# Patient Record
Sex: Male | Born: 1990 | Race: Black or African American | Hispanic: No | Marital: Married
Health system: Southern US, Community
[De-identification: ages and names within clinical notes are randomized; demographics above are authoritative.]

---

## 2021-02-21 ENCOUNTER — Other Ambulatory Visit: Payer: Self-pay

## 2021-02-21 ENCOUNTER — Emergency Department
Admission: EM | Admit: 2021-02-21 | Discharge: 2021-02-21 | Disposition: A | Discharge status: Home / Self Care | Attending: Emergency Medicine | Admitting: Emergency Medicine

## 2021-02-21 ENCOUNTER — Emergency Department

## 2021-02-21 DIAGNOSIS — M779 Enthesopathy, unspecified: Secondary | ICD-10-CM | POA: Insufficient documentation

## 2021-02-21 DIAGNOSIS — M25572 Pain in left ankle and joints of left foot: Secondary | ICD-10-CM | POA: Diagnosis present

## 2021-02-21 MED ORDER — IBUPROFEN 400 MG PO TABS
400.0000 mg | ORAL_TABLET | Freq: Once | ORAL | Status: AC
  Administered 2021-02-21: 400 mg via ORAL
  Filled 2021-02-21: qty 1

## 2021-02-21 MED ORDER — ACETAMINOPHEN 500 MG PO TABS
1000.0000 mg | ORAL_TABLET | Freq: Once | ORAL | Status: AC
  Administered 2021-02-21: 1000 mg via ORAL
  Filled 2021-02-21: qty 2

## 2021-02-21 NOTE — ED Provider Notes (Signed)
[facility] Emergency Department Provider Note  ____________________________________________   Event Date/Time   First MD Initiated Contact with Patient 02/21/21 1301     (approximate)  I have reviewed the triage vital signs and the nursing notes.   HISTORY  Chief Complaint Ankle Pain   HPI Manuel Wright is a 30 y.o. male with a past medical history of injury in the left foot requiring surgery in 2019 without any subsequent issues who presents for assessment approximately 3 days of some pain and a little bit of swelling extending from the left medial midfoot along the posterior aspect of the inside of the ankle.  No pain over the outside aspect of the ankle or any other swelling redness, or other overlying skin changes.  Patient does not recall any recent injuries or falls.  He denies any other sick symptoms including fevers, chills, cough, nausea, vomiting, diarrhea, dysuria, rash or pain in any other joint.  He has no other acute concerns at this time.  He has not taken any analgesia today.         History reviewed. No pertinent past medical history.  There are no problems to display for this patient.   History reviewed. No pertinent surgical history.  Prior to Admission medications   Not on File    Allergies Patient has no allergy information on record.  No family history on file.  Social History    Review of Systems  Review of Systems  Constitutional:  Negative for chills and fever.  HENT:  Negative for sore throat.   Eyes:  Negative for pain.  Respiratory:  Negative for cough and stridor.   Cardiovascular:  Negative for chest pain.  Gastrointestinal:  Negative for vomiting.  Genitourinary:  Negative for dysuria.  Musculoskeletal:  Positive for joint pain (L ankle).  Skin:  Negative for rash.  Neurological:  Negative for seizures, loss of consciousness and headaches.  Psychiatric/Behavioral:  Negative for suicidal ideas.   All other  systems reviewed and are negative.    ____________________________________________   PHYSICAL EXAM:  VITAL SIGNS: ED Triage Vitals  Enc Vitals Group     BP 02/21/21 1208 114/77     Pulse Rate 02/21/21 1208 73     Resp 02/21/21 1208 16     Temp 02/21/21 1208 98.7 F (37.1 C)     Temp src --      SpO2 02/21/21 1208 100 %     Weight 02/21/21 1210 170 lb (77.1 kg)     Height 02/21/21 1210 5\' 8"  (1.727 m)     Head Circumference --      Peak Flow --      Pain Score 02/21/21 1214 10     Pain Loc --      Pain Edu? --      Excl. in GC? --    Vitals:   02/21/21 1208  BP: 114/77  Pulse: 73  Resp: 16  Temp: 98.7 F (37.1 C)  SpO2: 100%   Physical Exam Vitals and nursing note reviewed.  Constitutional:      Appearance: He is well-developed.  HENT:     Head: Normocephalic and atraumatic.     Right Ear: External ear normal.     Left Ear: External ear normal.     Nose: Nose normal.  Eyes:     Conjunctiva/sclera: Conjunctivae normal.  Cardiovascular:     Rate and Rhythm: Normal rate and regular rhythm.     Heart sounds: No  murmur heard. Pulmonary:     Effort: Pulmonary effort is normal. No respiratory distress.     Breath sounds: Normal breath sounds.  Abdominal:     Palpations: Abdomen is soft.     Tenderness: There is no abdominal tenderness.  Musculoskeletal:     Cervical back: Neck supple.  Skin:    General: Skin is warm and dry.     Capillary Refill: Capillary refill takes less than 2 seconds.  Neurological:     Mental Status: He is alert and oriented to person, place, and time.  Psychiatric:        Mood and Affect: Mood normal.    Patient mild tenderness along the distribution of the left posterior tibialis tendon extending from the medial left midfoot along the posterior aspect of the medial malleolus to the left tibial insertion.  There is no large effusion deformity around the ankle itself and patient is able to plantar and dorsiflex.  2+ DP pulse.  Sensation  intact light touch about the foot. ____________________________________________   LABS (all labs ordered are listed, but only abnormal results are displayed)  Labs Reviewed - No data to display ____________________________________________  EKG  ____________________________________________  RADIOLOGY  ED MD interpretation: Plain film left ankle shows sequelae of remote injuries and small auscultations the medial talus.  Less likely an avulsion fracture.  No other clear acute fracture or dislocation.   Official radiology report(s): DG Ankle Complete Left  Result Date: 02/21/2021 CLINICAL DATA:  Left ankle pain EXAM: LEFT ANKLE COMPLETE - 3+ VIEW COMPARISON:  None. FINDINGS: Small ossicle adjacent to the medial talus. Ankle mortise is well maintained on nonweightbearing views. Minimal degenerative changes of the midfoot. Soft tissues are unremarkable. IMPRESSION: Small ossicle seen adjacent to the medial talus, likely sequela of remote prior trauma, although avulsion fracture could have a similar appearance. Correlate for point tenderness. Electronically Signed   By: Allegra Lai MD   On: 02/21/2021 13:36    ____________________________________________   PROCEDURES  Procedure(s) performed (including Critical Care):  Procedures   ____________________________________________   INITIAL IMPRESSION / ASSESSMENT AND PLAN / ED COURSE      Patient presents with above-stated history exam for assessment approximately 3 days of nontraumatic pain in the left midfoot extending along the inside posterior to the medial malleolus to the left lower leg to the mid posterior tibia.  Is some mild tenderness along this aspect and very minimal swelling on the midfoot.  He is afebrile and hemodynamically stable.  He is otherwise neurovascular intact.  Impression is likely tendinitis.  No historical or exam features to suggest a septic joint, cellulitis, DVT or other acute infectious process.  No  historical or exam features to suggest acute injury and x-ray shows no clear acute fracture or dislocation.  Will place patient in immobilizer boot and instructed to take ibuprofen and Tylenol.  Instructed to follow-up with PCP.  Discharged stable condition.  Strict return precautions advised and discussed       ____________________________________________   FINAL CLINICAL IMPRESSION(S) / ED DIAGNOSES  Final diagnoses:  Tendonitis    Medications  acetaminophen (TYLENOL) tablet 1,000 mg (has no administration in time range)  ibuprofen (ADVIL) tablet 400 mg (has no administration in time range)     ED Discharge Orders     None        Note:  This document was prepared using Dragon voice recognition software and may include unintentional dictation errors.    Gilles Chiquito, MD 02/21/21  1415  

## 2021-02-21 NOTE — ED Triage Notes (Signed)
Pt to ER with complaints of left sided ankle pain since Monday, reports possibly walking wrong on it but denies strenuous activity or falling. Reports driving from [place]. Reports hx of surgery on left ankle in May 2019.

## 2021-02-21 NOTE — ED Notes (Signed)
Pt in XRAY 

## 2022-10-29 IMAGING — CR DG ANKLE COMPLETE 3+V*L*
1 series · 3 of 3 positions shown · non-contrast
Comparison: None.

CLINICAL DATA: Left ankle pain

EXAM:
LEFT ANKLE COMPLETE - 3+ VIEW

[Series 1: x ankle ap left · 0.14mm/px · 3 of 3 slices shown]
[im 1/3]
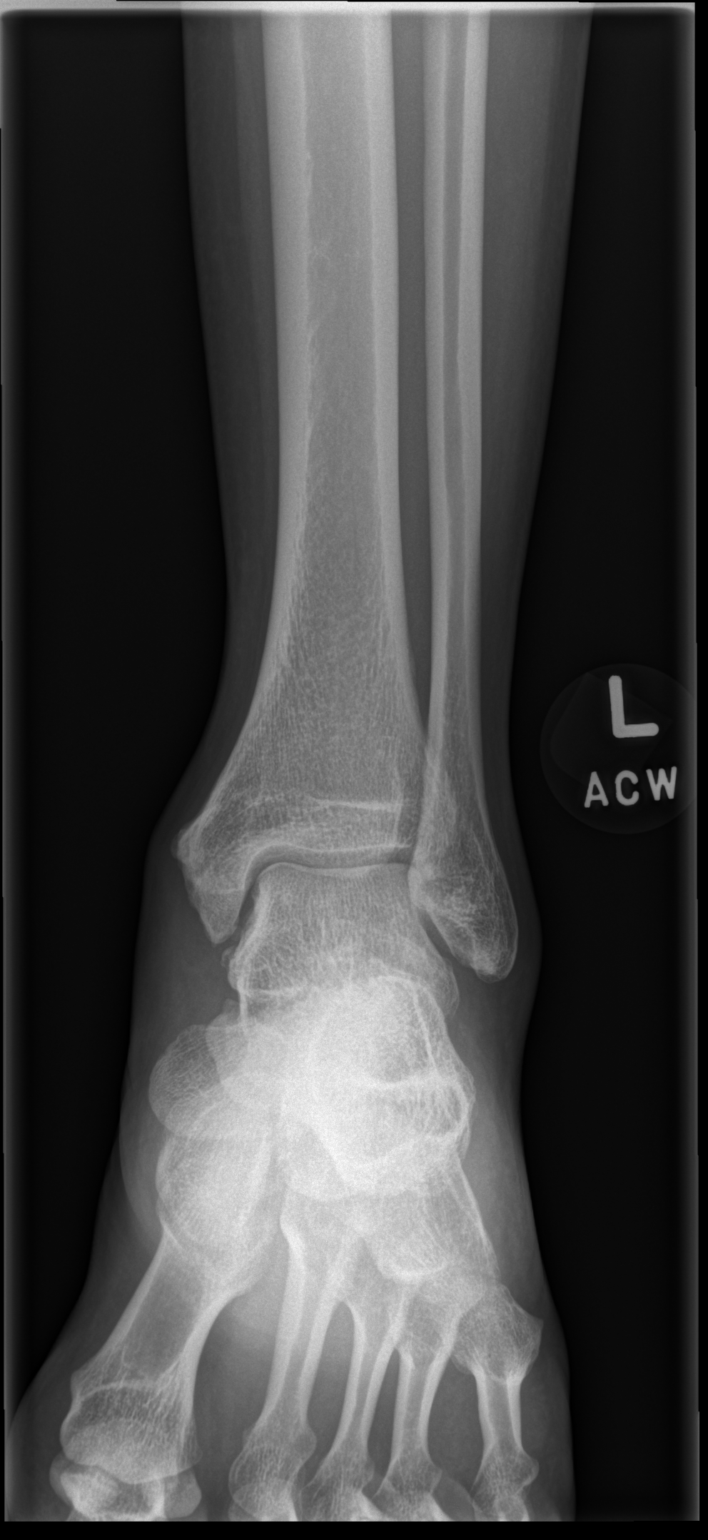
[im 2/3]
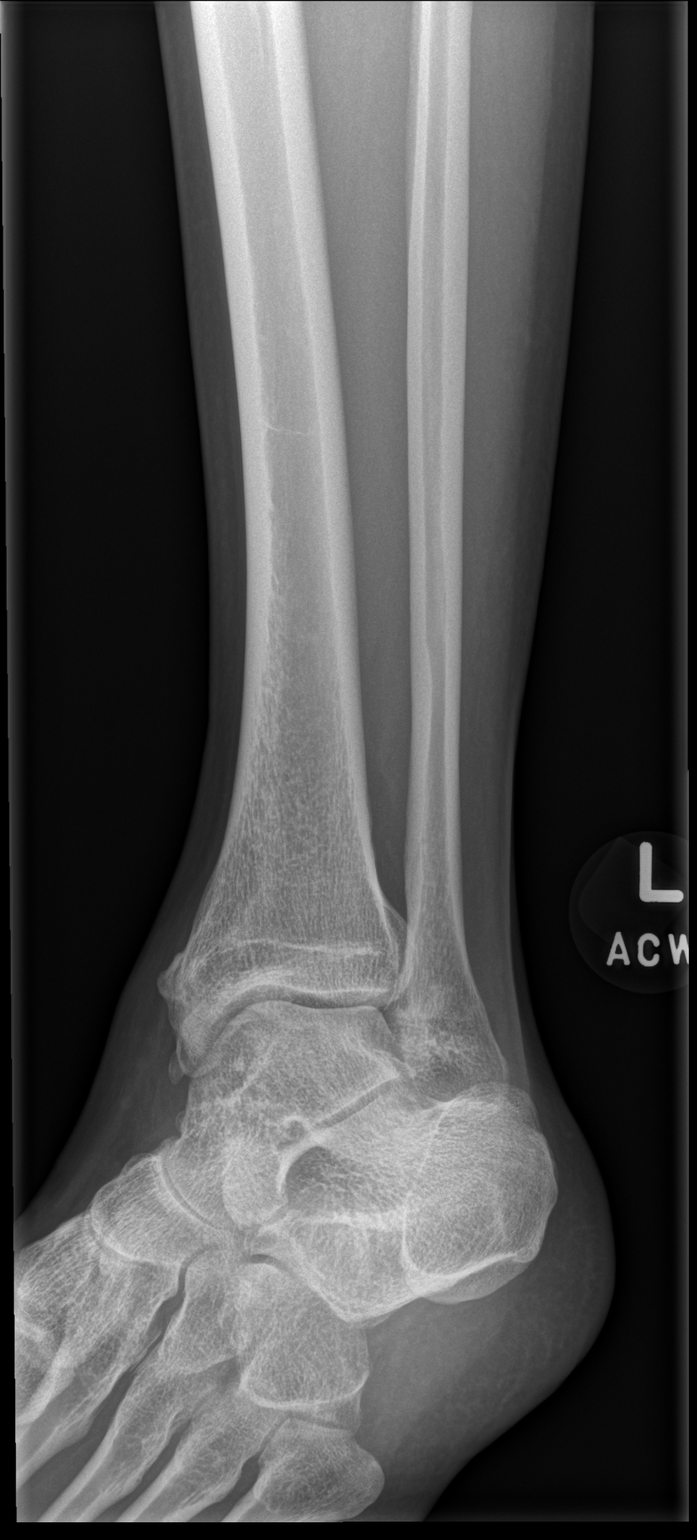
[im 3/3]
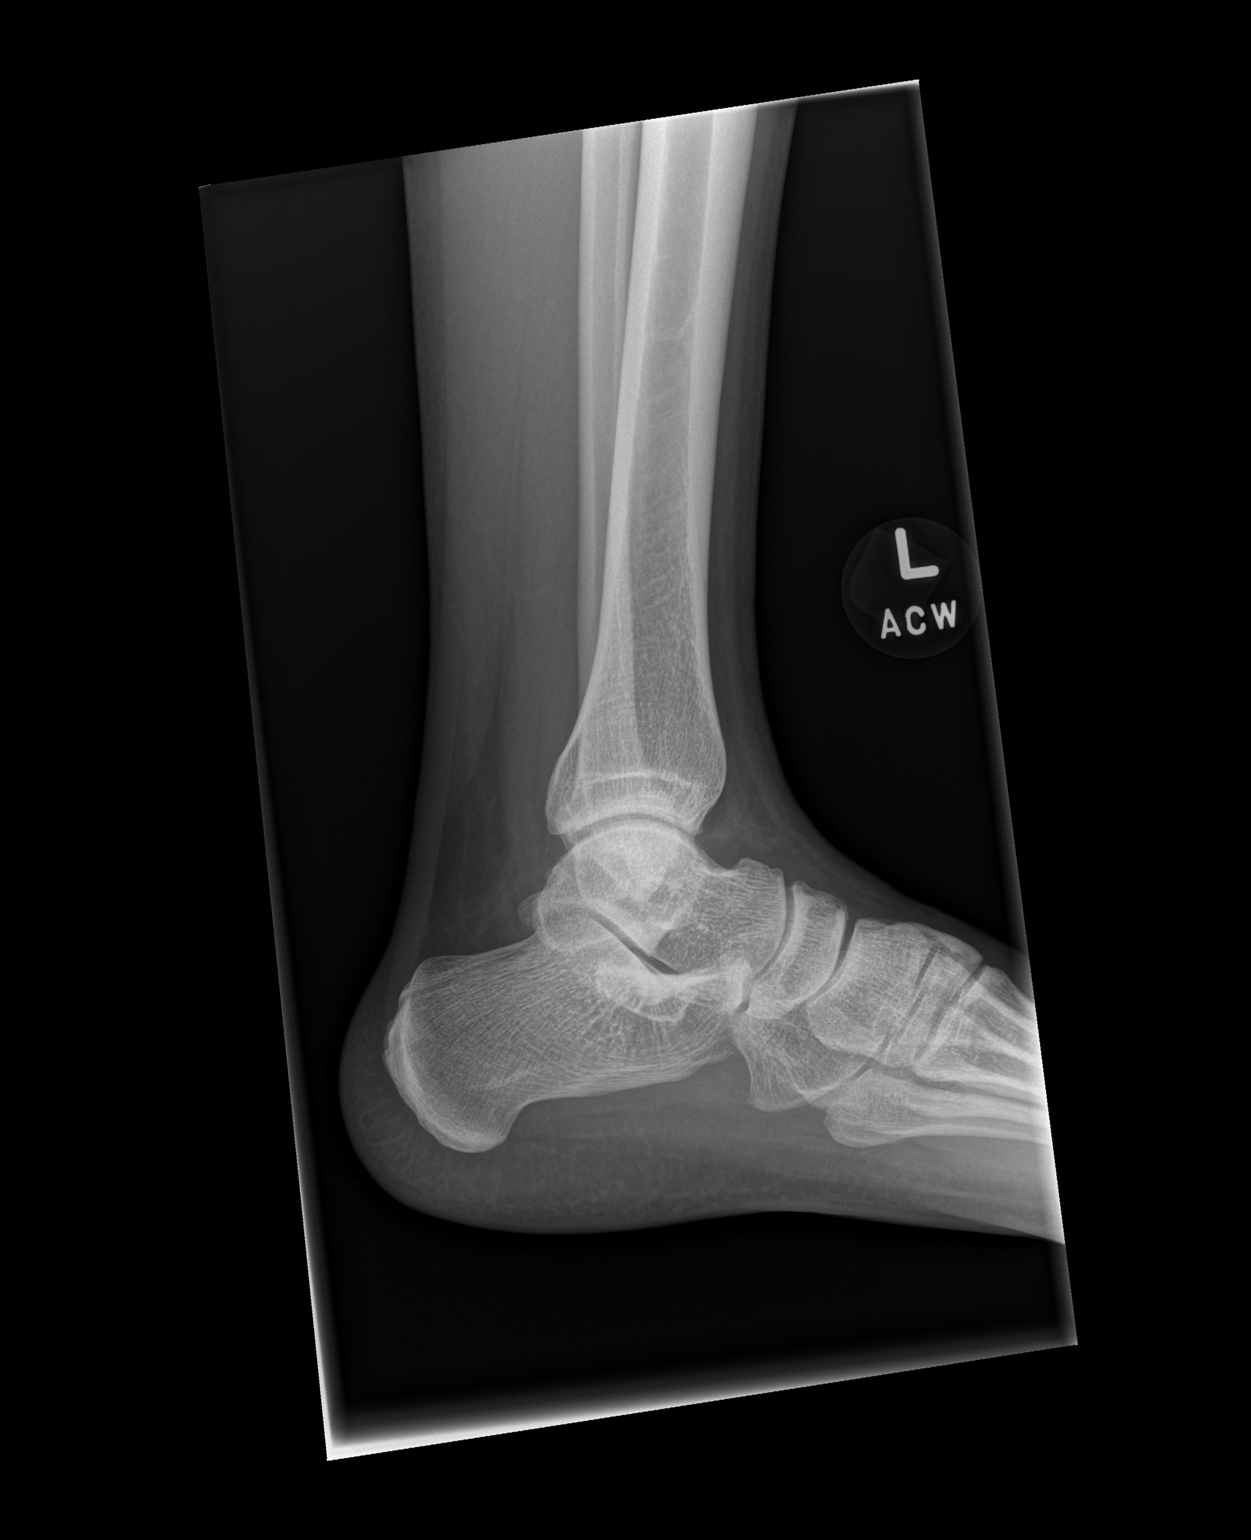

[3 of 3 positions shown; findings below may reference images not displayed]

FINDINGS: Small ossicle adjacent to the medial talus. Ankle mortise is well
maintained on nonweightbearing views. Minimal degenerative changes
of the midfoot. Soft tissues are unremarkable.
IMPRESSION: Small ossicle seen adjacent to the medial talus, likely sequela of
remote prior trauma, although avulsion fracture could have a similar
appearance. Correlate for point tenderness.
